# Patient Record
Sex: Female | Born: 1990 | Race: White | Hispanic: No | Marital: Married | Smoking: Never smoker
Health system: Southern US, Community
[De-identification: ages and names within clinical notes are randomized; demographics above are authoritative.]

## PROBLEM LIST (undated history)

## (undated) DIAGNOSIS — D649 Anemia, unspecified: Secondary | ICD-10-CM

## (undated) DIAGNOSIS — E039 Hypothyroidism, unspecified: Secondary | ICD-10-CM

## (undated) HISTORY — PX: DENTAL SURGERY: SHX609

## (undated) HISTORY — PX: TONSILLECTOMY: SUR1361

## (undated) HISTORY — PX: MICRODISCECTOMY LUMBAR: SUR864

---

## 2017-01-20 ENCOUNTER — Emergency Department (HOSPITAL_COMMUNITY)
Admission: EM | Admit: 2017-01-20 | Discharge: 2017-01-20 | Disposition: A | Discharge status: Home / Self Care | Payer: BC Managed Care – PPO | Attending: Emergency Medicine | Admitting: Emergency Medicine

## 2017-01-20 ENCOUNTER — Encounter (HOSPITAL_COMMUNITY): Payer: Self-pay

## 2017-01-20 DIAGNOSIS — M5441 Lumbago with sciatica, right side: Secondary | ICD-10-CM | POA: Diagnosis not present

## 2017-01-20 DIAGNOSIS — M545 Low back pain: Secondary | ICD-10-CM | POA: Diagnosis present

## 2017-01-20 MED ORDER — OXYCODONE-ACETAMINOPHEN 5-325 MG PO TABS
1.0000 | ORAL_TABLET | Freq: Once | ORAL | Status: AC
  Administered 2017-01-20: 1 via ORAL
  Filled 2017-01-20: qty 1

## 2017-01-20 MED ORDER — CYCLOBENZAPRINE HCL 10 MG PO TABS: 10.0000 mg | ORAL_TABLET | Freq: Two times a day (BID) | ORAL | 0 refills | Status: DC | PRN

## 2017-01-20 MED ORDER — TRAMADOL HCL 50 MG PO TABS: 50.0000 mg | ORAL_TABLET | Freq: Four times a day (QID) | ORAL | 0 refills | Status: DC | PRN

## 2017-01-20 NOTE — ED Triage Notes (Signed)
Patient c/o back pain right lower back pain with pain radiating down the right leg. Patient has been seeing a chiropractor for this pain. Patient states the pain has been getting progressively worse. Patient states she has also been to an UC and received a steroid injection and muscle relaxants with no relief.

## 2017-01-20 NOTE — ED Provider Notes (Signed)
[facility]-EMERGENCY DEPT Provider Note   CSN: [id number] Arrival date & time: 01/20/17  0803     History   Chief Complaint Chief Complaint  Patient presents with  . Back Pain    HPI Alexandra Hamilton is a 26 y.o. female without significant past medical history, presenting to the ED with 5 weeks of persistent worsening right-sided lower back pain with radiculopathy down the right leg. Patient states she has seen both a chiropractor and urgent care for these symptoms without relief. She states urgent care recently prescribed her Flexeril and prednisone which she just finished yesterday, however pain has been persistent and worsening. She states pain is sharp, located in the right buttock with radiation down the lateral aspect of her leg. No recent injury. Denies midline back pain, numbness or tingling, bowel or bladder incontinence, saddle paresthesia, fever, urinary symptoms or abdominal pain, history of cancer or IV drug use.  The history is provided by the patient.    Past Medical History:  Diagnosis Date  . Thyroid disease     There are no active problems to display for this patient.   Past Surgical History:  Procedure Laterality Date  . DENTAL SURGERY    . TONSILLECTOMY      OB History    No data available       Home Medications    Prior to Admission medications   Medication Sig Start Date End Date Taking? Authorizing Provider  cyclobenzaprine (FLEXERIL) 10 MG tablet Take 1 tablet (10 mg total) by mouth 2 (two) times daily as needed for muscle spasms. 01/20/17   Russo, [place] N, PA-C  traMADol (ULTRAM) 50 MG tablet Take 1 tablet (50 mg total) by mouth every 6 (six) hours as needed for severe pain. 01/20/17   Russo, [place] N, PA-C    Family History History reviewed. No pertinent family history.  Social History Social History  Substance Use Topics  . Smoking status: Never Smoker  . Smokeless tobacco: Never Used  . Alcohol use Yes   Comment: socially     Allergies   Patient has no known allergies.   Review of Systems Review of Systems  Constitutional: Negative for fever.  Gastrointestinal: Negative for abdominal pain.       No bowel incontinence  Genitourinary: Negative for difficulty urinating, dysuria and flank pain.  Musculoskeletal: Positive for back pain and myalgias. Negative for neck pain.  Neurological: Negative for weakness and numbness.     Physical Exam Updated Vital Signs BP 122/85 (BP Location: Right Arm)   Pulse 75   Temp (!) 97.5 F (36.4 C) (Oral)   Resp 18   Ht 5\' 8"  (1.727 m)   Wt 68 kg (150 lb)   LMP 12/19/2016   SpO2 98%   BMI 22.81 kg/m   Physical Exam  Constitutional: She appears well-developed and well-nourished.  Patient appears uncomfortable, constantly changing positions and going from sitting to standing.  HENT:  Head: Normocephalic and atraumatic.  Eyes: Conjunctivae are normal.  Neck: Normal range of motion. Neck supple.  Cardiovascular: Normal rate, regular rhythm, normal heart sounds and intact distal pulses.   Pulmonary/Chest: Effort normal and breath sounds normal.  Abdominal: Soft. Bowel sounds are normal. There is no tenderness.  Musculoskeletal:       Back:  No midline C, T, or L-spine tenderness, no bony step-offs, no gross deformities. Tenderness located right gluteus. Moving all extremities.  Neurological:  Motor:  Normal tone. 5/5 in upper and lower extremities  bilaterally including strong and equal grip strength and dorsiflexion/plantar flexion Sensory: Pinprick and light touch normal in all extremities.  Deep Tendon Reflexes: 2+ and symmetric in the biceps and patella Gait: normal gait and balance CV: distal pulses palpable throughout    Psychiatric: She has a normal mood and affect. Her behavior is normal.  Nursing note and vitals reviewed.    ED Treatments / Results  Labs (all labs ordered are listed, but only abnormal results are  displayed) Labs Reviewed - No data to display  EKG  EKG Interpretation None       Radiology No results found.  Procedures Procedures (including critical care time)  Medications Ordered in ED Medications  oxyCODONE-acetaminophen (PERCOCET/ROXICET) 5-325 MG per tablet 1 tablet (1 tablet Oral Given 01/20/17 0926)     Initial Impression / Assessment and Plan / ED Course  I have reviewed the triage vital signs and the nursing notes.  Pertinent labs & imaging results that were available during my care of the patient were reviewed by me and considered in my medical decision making (see chart for details).     Patient presenting with 5 weeks of right-sided lower back pain with radiculopathy, consistent with sciatica. Normal neurological exam, no evidence of urinary incontinence or retention, pain is consistently reproducible. No midline spinal tenderness or recent trauma; imaging not indicated. There is no evidence of AAA or concern for dissection at this time.   Patient can walk but states is painful.  No loss of bowel or bladder control.  No concern for cauda equina.  No fever, night sweats, weight loss, h/o cancer, IVDU.  Pain treated here in the department with adequate improvement. RICE protocol and pain medicine indicated and discussed with patient. Recommend patient establish primary care for management of pain, and discussion of need for specialist referral versus PT. I have also discussed reasons to return immediately to the ER. Patient expresses understanding and agrees with plan.  Patient discussed with Dr. Ethelda Chick.  [place] Controlled Substance reporting System queried  Discussed results, findings, treatment and follow up. Patient advised of return precautions. Patient verbalized understanding and agreed with plan.   Final Clinical Impressions(s) / ED Diagnoses   Final diagnoses:  Acute right-sided low back pain with right-sided sciatica    New  Prescriptions Discharge Medication List as of 01/20/2017 10:06 AM    START taking these medications   Details  cyclobenzaprine (FLEXERIL) 10 MG tablet Take 1 tablet (10 mg total) by mouth 2 (two) times daily as needed for muscle spasms., Starting Thu 01/20/2017, Print    traMADol (ULTRAM) 50 MG tablet Take 1 tablet (50 mg total) by mouth every 6 (six) hours as needed for severe pain., Starting Thu 01/20/2017, Print         Timothy Lasso, [place] N, PA-C 01/20/17 1504    Doug Sou, MD 01/20/17 [phone]

## 2017-01-20 NOTE — Discharge Instructions (Signed)
Please read instructions below.  It is important that you establish primary care to discuss need for physical therapy versus visit with specialist. Use the phone number on the back of the discharge paperwork to find a primary care. You can take tramadol every 6 hours as needed for pain.  You can take flexeril at bedtime or every 12 hours as needed for muscle spasm. Drink plenty of water. You can try gentle stretches. Return to ER if new numbness or tingling in your arms or legs, inability to urinate, inability to hold your bowels, or weakness in your extremities.

## 2017-03-04 ENCOUNTER — Encounter (HOSPITAL_COMMUNITY): Payer: Self-pay | Admitting: Emergency Medicine

## 2017-03-04 ENCOUNTER — Emergency Department (HOSPITAL_COMMUNITY)
Admission: EM | Admit: 2017-03-04 | Discharge: 2017-03-04 | Disposition: A | Discharge status: Home / Self Care | Payer: BC Managed Care – PPO | Attending: Emergency Medicine | Admitting: Emergency Medicine

## 2017-03-04 ENCOUNTER — Other Ambulatory Visit: Payer: Self-pay

## 2017-03-04 DIAGNOSIS — Z5321 Procedure and treatment not carried out due to patient leaving prior to being seen by health care provider: Secondary | ICD-10-CM | POA: Insufficient documentation

## 2017-03-04 DIAGNOSIS — R51 Headache: Secondary | ICD-10-CM | POA: Diagnosis present

## 2017-03-04 NOTE — ED Notes (Signed)
Called Pt to be roomed x3 no response. 

## 2017-03-04 NOTE — ED Triage Notes (Signed)
Pt complaint of frontal headache, neck pain, n/v for a few days; recent back surgery.

## 2017-03-04 NOTE — ED Notes (Signed)
Called Pt to be roomed x2 no response. 

## 2017-03-04 NOTE — ED Notes (Signed)
Called Pt to be roomed x1 no response.

## 2017-03-07 ENCOUNTER — Ambulatory Visit (HOSPITAL_COMMUNITY)
Admission: RE | Admit: 2017-03-07 | Discharge: 2017-03-07 | Disposition: A | Discharge status: Home / Self Care | Payer: BC Managed Care – PPO | Source: Ambulatory Visit | Attending: Neurological Surgery | Admitting: Neurological Surgery

## 2017-03-07 ENCOUNTER — Other Ambulatory Visit (HOSPITAL_COMMUNITY): Payer: Self-pay | Admitting: Neurological Surgery

## 2017-03-07 DIAGNOSIS — Z9889 Other specified postprocedural states: Secondary | ICD-10-CM | POA: Diagnosis not present

## 2017-03-07 DIAGNOSIS — R937 Abnormal findings on diagnostic imaging of other parts of musculoskeletal system: Secondary | ICD-10-CM | POA: Diagnosis not present

## 2017-03-07 DIAGNOSIS — G971 Other reaction to spinal and lumbar puncture: Secondary | ICD-10-CM

## 2017-03-07 MED ORDER — GADOBENATE DIMEGLUMINE 529 MG/ML IV SOLN
15.0000 mL | Freq: Once | INTRAVENOUS | Status: AC
  Administered 2017-03-07: 14 mL via INTRAVENOUS

## 2017-05-03 ENCOUNTER — Other Ambulatory Visit: Payer: Self-pay | Admitting: Neurological Surgery

## 2017-05-03 DIAGNOSIS — M5126 Other intervertebral disc displacement, lumbar region: Secondary | ICD-10-CM

## 2017-05-16 ENCOUNTER — Encounter: Payer: Self-pay | Admitting: Radiology

## 2017-05-16 ENCOUNTER — Ambulatory Visit
Admission: RE | Admit: 2017-05-16 | Discharge: 2017-05-16 | Disposition: A | Discharge status: Home / Self Care | Payer: BC Managed Care – PPO | Source: Ambulatory Visit | Attending: Neurological Surgery | Admitting: Neurological Surgery

## 2017-05-16 DIAGNOSIS — M5126 Other intervertebral disc displacement, lumbar region: Secondary | ICD-10-CM

## 2017-05-16 MED ORDER — DIAZEPAM 5 MG PO TABS
5.0000 mg | ORAL_TABLET | Freq: Once | ORAL | Status: AC
  Administered 2017-05-16: 5 mg via ORAL

## 2017-05-16 MED ORDER — DIAZEPAM 5 MG PO TABS: 10.0000 mg | ORAL_TABLET | Freq: Once | ORAL | Status: DC

## 2017-05-16 MED ORDER — IOPAMIDOL (ISOVUE-M 200) INJECTION 41%
15.0000 mL | Freq: Once | INTRAMUSCULAR | Status: AC
  Administered 2017-05-16: 15 mL via INTRATHECAL

## 2017-05-16 NOTE — Discharge Instructions (Signed)

## 2017-09-06 ENCOUNTER — Other Ambulatory Visit: Payer: Self-pay | Admitting: Neurological Surgery

## 2017-09-06 DIAGNOSIS — G9782 Other postprocedural complications and disorders of nervous system: Secondary | ICD-10-CM

## 2017-09-30 ENCOUNTER — Other Ambulatory Visit: Payer: BC Managed Care – PPO

## 2017-10-21 ENCOUNTER — Ambulatory Visit
Admission: RE | Admit: 2017-10-21 | Discharge: 2017-10-21 | Disposition: A | Discharge status: Home / Self Care | Payer: BC Managed Care – PPO | Source: Ambulatory Visit | Attending: Neurological Surgery | Admitting: Neurological Surgery

## 2017-10-21 DIAGNOSIS — G9782 Other postprocedural complications and disorders of nervous system: Secondary | ICD-10-CM

## 2017-10-21 DIAGNOSIS — G9600 Cerebrospinal fluid leak, unspecified: Secondary | ICD-10-CM

## 2017-10-21 MED ORDER — IOPAMIDOL (ISOVUE-M 200) INJECTION 41%
15.0000 mL | Freq: Once | INTRAMUSCULAR | Status: AC
  Administered 2017-10-21: 15 mL via INTRATHECAL

## 2017-10-21 MED ORDER — DIAZEPAM 5 MG PO TABS
5.0000 mg | ORAL_TABLET | Freq: Once | ORAL | Status: AC
  Administered 2017-10-21: 5 mg via ORAL

## 2017-10-21 MED ORDER — DIAZEPAM 5 MG PO TABS
10.0000 mg | ORAL_TABLET | Freq: Once | ORAL | Status: DC
Start: 2017-10-21 — End: 2017-10-21

## 2017-10-21 NOTE — Discharge Instructions (Signed)

## 2017-10-27 ENCOUNTER — Other Ambulatory Visit: Payer: Self-pay | Admitting: Neurological Surgery

## 2017-10-31 ENCOUNTER — Encounter (HOSPITAL_COMMUNITY): Payer: Self-pay | Admitting: *Deleted

## 2017-10-31 ENCOUNTER — Other Ambulatory Visit: Payer: Self-pay

## 2017-10-31 NOTE — Progress Notes (Signed)
Denies chest pain, shob, or cardiology visit. Denies cardiac test. Patient aware of needed urine specimen morning of surgery.

## 2017-11-02 ENCOUNTER — Other Ambulatory Visit: Payer: Self-pay

## 2017-11-02 ENCOUNTER — Encounter (HOSPITAL_COMMUNITY)
Admission: RE | Disposition: A | Discharge status: Home / Self Care | Payer: Self-pay | Source: Home / Self Care | Attending: Neurological Surgery

## 2017-11-02 ENCOUNTER — Inpatient Hospital Stay (HOSPITAL_COMMUNITY): Payer: BC Managed Care – PPO | Admitting: Certified Registered"

## 2017-11-02 ENCOUNTER — Inpatient Hospital Stay (HOSPITAL_COMMUNITY)
Admission: RE | Admit: 2017-11-02 | Discharge: 2017-11-04 | DRG: 029 | Disposition: A | Discharge status: Home / Self Care | Payer: BC Managed Care – PPO | Attending: Neurological Surgery | Admitting: Neurological Surgery

## 2017-11-02 ENCOUNTER — Encounter (HOSPITAL_COMMUNITY): Payer: Self-pay | Admitting: Anesthesiology

## 2017-11-02 DIAGNOSIS — G9619 Other disorders of meninges, not elsewhere classified: Secondary | ICD-10-CM | POA: Diagnosis present

## 2017-11-02 DIAGNOSIS — G9600 Cerebrospinal fluid leak, unspecified: Secondary | ICD-10-CM | POA: Diagnosis present

## 2017-11-02 DIAGNOSIS — Z79890 Hormone replacement therapy (postmenopausal): Secondary | ICD-10-CM

## 2017-11-02 DIAGNOSIS — G9782 Other postprocedural complications and disorders of nervous system: Secondary | ICD-10-CM | POA: Diagnosis present

## 2017-11-02 DIAGNOSIS — Y839 Surgical procedure, unspecified as the cause of abnormal reaction of the patient, or of later complication, without mention of misadventure at the time of the procedure: Secondary | ICD-10-CM | POA: Diagnosis present

## 2017-11-02 DIAGNOSIS — E039 Hypothyroidism, unspecified: Secondary | ICD-10-CM | POA: Diagnosis present

## 2017-11-02 HISTORY — DX: Hypothyroidism, unspecified: E03.9

## 2017-11-02 HISTORY — PX: LUMBAR LAMINECTOMY/DECOMPRESSION MICRODISCECTOMY: SHX5026

## 2017-11-02 HISTORY — DX: Anemia, unspecified: D64.9

## 2017-11-02 LAB — CBC
HEMATOCRIT: 39.2 % (ref 36.0–46.0)
Hemoglobin: 12.9 g/dL (ref 12.0–15.0)
MCH: 29.3 pg (ref 26.0–34.0)
MCHC: 32.9 g/dL (ref 30.0–36.0)
MCV: 88.9 fL (ref 78.0–100.0)
PLATELETS: 191 10*3/uL (ref 150–400)
RBC: 4.41 MIL/uL (ref 3.87–5.11)
RDW: 12.4 % (ref 11.5–15.5)
WBC: 7.1 10*3/uL (ref 4.0–10.5)

## 2017-11-02 LAB — POCT PREGNANCY, URINE: PREG TEST UR: NEGATIVE

## 2017-11-02 SURGERY — LUMBAR LAMINECTOMY/DECOMPRESSION MICRODISCECTOMY 1 LEVEL
Anesthesia: General | Site: Back

## 2017-11-02 MED ORDER — HYDROMORPHONE HCL 1 MG/ML IJ SOLN
INTRAMUSCULAR | Status: AC
  Filled 2017-11-02: qty 1

## 2017-11-02 MED ORDER — MENTHOL 3 MG MT LOZG: 1.0000 | LOZENGE | OROMUCOSAL | Status: DC | PRN

## 2017-11-02 MED ORDER — LIDOCAINE 2% (20 MG/ML) 5 ML SYRINGE
INTRAMUSCULAR | Status: AC
  Filled 2017-11-02: qty 5

## 2017-11-02 MED ORDER — FENTANYL CITRATE (PF) 100 MCG/2ML IJ SOLN
INTRAMUSCULAR | Status: AC
  Filled 2017-11-02: qty 2

## 2017-11-02 MED ORDER — FENTANYL CITRATE (PF) 250 MCG/5ML IJ SOLN
INTRAMUSCULAR | Status: AC
  Filled 2017-11-02: qty 5

## 2017-11-02 MED ORDER — FENTANYL CITRATE (PF) 100 MCG/2ML IJ SOLN
25.0000 ug | INTRAMUSCULAR | Status: DC | PRN
  Administered 2017-11-02 (×3): 50 ug via INTRAVENOUS

## 2017-11-02 MED ORDER — SODIUM CHLORIDE 0.9% FLUSH
3.0000 mL | Freq: Two times a day (BID) | INTRAVENOUS | Status: DC
  Administered 2017-11-03 – 2017-11-04 (×2): 3 mL via INTRAVENOUS

## 2017-11-02 MED ORDER — HYDROMORPHONE HCL 1 MG/ML IJ SOLN
0.2500 mg | INTRAMUSCULAR | Status: DC | PRN
  Administered 2017-11-02: 0.5 mg via INTRAVENOUS

## 2017-11-02 MED ORDER — ACETAMINOPHEN 325 MG PO TABS: 650.0000 mg | ORAL_TABLET | ORAL | Status: DC | PRN

## 2017-11-02 MED ORDER — ONDANSETRON HCL 4 MG/2ML IJ SOLN
INTRAMUSCULAR | Status: DC | PRN
  Administered 2017-11-02: 4 mg via INTRAVENOUS

## 2017-11-02 MED ORDER — MIDAZOLAM HCL 2 MG/2ML IJ SOLN
INTRAMUSCULAR | Status: AC
  Filled 2017-11-02: qty 2

## 2017-11-02 MED ORDER — FENTANYL CITRATE (PF) 100 MCG/2ML IJ SOLN
INTRAMUSCULAR | Status: AC
Start: 2017-11-02 — End: 2017-11-03
  Filled 2017-11-02: qty 2

## 2017-11-02 MED ORDER — CELECOXIB 200 MG PO CAPS
200.0000 mg | ORAL_CAPSULE | Freq: Two times a day (BID) | ORAL | Status: DC
  Administered 2017-11-02 – 2017-11-04 (×4): 200 mg via ORAL
  Filled 2017-11-02 (×4): qty 1

## 2017-11-02 MED ORDER — OXYCODONE HCL 5 MG PO TABS
5.0000 mg | ORAL_TABLET | ORAL | Status: DC | PRN
  Administered 2017-11-03 – 2017-11-04 (×5): 5 mg via ORAL
  Filled 2017-11-02 (×6): qty 1

## 2017-11-02 MED ORDER — CHLORHEXIDINE GLUCONATE CLOTH 2 % EX PADS: 6.0000 | MEDICATED_PAD | Freq: Once | CUTANEOUS | Status: DC

## 2017-11-02 MED ORDER — SUGAMMADEX SODIUM 200 MG/2ML IV SOLN
INTRAVENOUS | Status: DC | PRN
  Administered 2017-11-02: 150 mg via INTRAVENOUS

## 2017-11-02 MED ORDER — LEVOTHYROXINE SODIUM 75 MCG PO TABS
75.0000 ug | ORAL_TABLET | Freq: Every day | ORAL | Status: DC
  Administered 2017-11-03 – 2017-11-04 (×2): 75 ug via ORAL
  Filled 2017-11-02 (×3): qty 1

## 2017-11-02 MED ORDER — ONDANSETRON HCL 4 MG/2ML IJ SOLN
INTRAMUSCULAR | Status: AC
  Filled 2017-11-02: qty 2

## 2017-11-02 MED ORDER — CEFAZOLIN SODIUM-DEXTROSE 1-4 GM/50ML-% IV SOLN
1.0000 g | Freq: Three times a day (TID) | INTRAVENOUS | Status: AC
  Administered 2017-11-02 – 2017-11-03 (×2): 1 g via INTRAVENOUS
  Filled 2017-11-02 (×2): qty 50

## 2017-11-02 MED ORDER — CEFAZOLIN SODIUM-DEXTROSE 2-4 GM/100ML-% IV SOLN
2.0000 g | INTRAVENOUS | Status: AC
  Administered 2017-11-02: 2 g via INTRAVENOUS
  Filled 2017-11-02: qty 100

## 2017-11-02 MED ORDER — PROMETHAZINE HCL 25 MG/ML IJ SOLN
6.2500 mg | INTRAMUSCULAR | Status: DC | PRN
  Administered 2017-11-02: 6.25 mg via INTRAVENOUS

## 2017-11-02 MED ORDER — ACETAMINOPHEN 650 MG RE SUPP: 650.0000 mg | RECTAL | Status: DC | PRN

## 2017-11-02 MED ORDER — SODIUM CHLORIDE 0.9 % IV SOLN
INTRAVENOUS | Status: DC | PRN
  Administered 2017-11-02: 16:00:00

## 2017-11-02 MED ORDER — PHENOL 1.4 % MT LIQD: 1.0000 | OROMUCOSAL | Status: DC | PRN

## 2017-11-02 MED ORDER — ONDANSETRON HCL 4 MG/2ML IJ SOLN: 4.0000 mg | Freq: Four times a day (QID) | INTRAMUSCULAR | Status: DC | PRN

## 2017-11-02 MED ORDER — HYDROMORPHONE HCL 1 MG/ML IJ SOLN
0.5000 mg | INTRAMUSCULAR | Status: DC | PRN
  Administered 2017-11-02 – 2017-11-03 (×2): 0.5 mg via INTRAVENOUS
  Filled 2017-11-02 (×2): qty 0.5

## 2017-11-02 MED ORDER — LIDOCAINE 2% (20 MG/ML) 5 ML SYRINGE
INTRAMUSCULAR | Status: DC | PRN
  Administered 2017-11-02: 60 mg via INTRAVENOUS

## 2017-11-02 MED ORDER — THROMBIN 5000 UNITS EX SOLR
CUTANEOUS | Status: DC | PRN
  Administered 2017-11-02 (×2): 5000 [IU] via TOPICAL

## 2017-11-02 MED ORDER — ONDANSETRON HCL 4 MG PO TABS: 4.0000 mg | ORAL_TABLET | Freq: Four times a day (QID) | ORAL | Status: DC | PRN

## 2017-11-02 MED ORDER — SODIUM CHLORIDE 0.9 % IV SOLN: 250.0000 mL | INTRAVENOUS | Status: DC

## 2017-11-02 MED ORDER — ROCURONIUM BROMIDE 10 MG/ML (PF) SYRINGE
PREFILLED_SYRINGE | INTRAVENOUS | Status: DC | PRN
  Administered 2017-11-02: 40 mg via INTRAVENOUS

## 2017-11-02 MED ORDER — ACETAMINOPHEN 10 MG/ML IV SOLN
1000.0000 mg | INTRAVENOUS | Status: AC
  Administered 2017-11-02: 1000 mg via INTRAVENOUS
  Filled 2017-11-02 (×2): qty 100

## 2017-11-02 MED ORDER — BUPIVACAINE HCL (PF) 0.25 % IJ SOLN
INTRAMUSCULAR | Status: DC | PRN
  Administered 2017-11-02: 8 mL

## 2017-11-02 MED ORDER — SODIUM CHLORIDE 0.9% FLUSH: 3.0000 mL | INTRAVENOUS | Status: DC | PRN

## 2017-11-02 MED ORDER — HEMOSTATIC AGENTS (NO CHARGE) OPTIME
TOPICAL | Status: DC | PRN
  Administered 2017-11-02 (×2): 1 via TOPICAL

## 2017-11-02 MED ORDER — LACTATED RINGERS IV SOLN
INTRAVENOUS | Status: DC
  Administered 2017-11-02 (×2): via INTRAVENOUS

## 2017-11-02 MED ORDER — THROMBIN 5000 UNITS EX SOLR
CUTANEOUS | Status: AC
  Filled 2017-11-02: qty 15000

## 2017-11-02 MED ORDER — 0.9 % SODIUM CHLORIDE (POUR BTL) OPTIME
TOPICAL | Status: DC | PRN
  Administered 2017-11-02: 1000 mL

## 2017-11-02 MED ORDER — DEXAMETHASONE SODIUM PHOSPHATE 10 MG/ML IJ SOLN
INTRAMUSCULAR | Status: AC
  Filled 2017-11-02: qty 1

## 2017-11-02 MED ORDER — PROMETHAZINE HCL 25 MG/ML IJ SOLN
INTRAMUSCULAR | Status: AC
  Filled 2017-11-02: qty 1

## 2017-11-02 MED ORDER — DEXAMETHASONE SODIUM PHOSPHATE 10 MG/ML IJ SOLN
INTRAMUSCULAR | Status: DC | PRN
  Administered 2017-11-02: 10 mg via INTRAVENOUS

## 2017-11-02 MED ORDER — SENNA 8.6 MG PO TABS
1.0000 | ORAL_TABLET | Freq: Two times a day (BID) | ORAL | Status: DC
  Administered 2017-11-02 – 2017-11-04 (×4): 8.6 mg via ORAL
  Filled 2017-11-02 (×4): qty 1

## 2017-11-02 MED ORDER — SUGAMMADEX SODIUM 200 MG/2ML IV SOLN
INTRAVENOUS | Status: AC
  Filled 2017-11-02: qty 2

## 2017-11-02 MED ORDER — POTASSIUM CHLORIDE IN NACL 20-0.9 MEQ/L-% IV SOLN
INTRAVENOUS | Status: DC
  Administered 2017-11-02 – 2017-11-03 (×2): via INTRAVENOUS
  Filled 2017-11-02 (×2): qty 1000

## 2017-11-02 MED ORDER — FENTANYL CITRATE (PF) 100 MCG/2ML IJ SOLN
INTRAMUSCULAR | Status: DC | PRN
  Administered 2017-11-02: 100 ug via INTRAVENOUS
  Administered 2017-11-02 (×4): 50 ug via INTRAVENOUS

## 2017-11-02 MED ORDER — PROPOFOL 10 MG/ML IV BOLUS
INTRAVENOUS | Status: AC
  Filled 2017-11-02: qty 20

## 2017-11-02 MED ORDER — MIDAZOLAM HCL 5 MG/5ML IJ SOLN
INTRAMUSCULAR | Status: DC | PRN
  Administered 2017-11-02: 2 mg via INTRAVENOUS

## 2017-11-02 MED ORDER — BUPIVACAINE HCL (PF) 0.25 % IJ SOLN
INTRAMUSCULAR | Status: AC
  Filled 2017-11-02: qty 30

## 2017-11-02 MED ORDER — PROPOFOL 10 MG/ML IV BOLUS
INTRAVENOUS | Status: DC | PRN
  Administered 2017-11-02: 150 mg via INTRAVENOUS
  Administered 2017-11-02: 30 mg via INTRAVENOUS
  Administered 2017-11-02: 20 mg via INTRAVENOUS

## 2017-11-02 SURGICAL SUPPLY — 50 items
BAG DECANTER FOR FLEXI CONT (MISCELLANEOUS) ×3 IMPLANT
BENZOIN TINCTURE PRP APPL 2/3 (GAUZE/BANDAGES/DRESSINGS) ×3 IMPLANT
BUR MATCHSTICK NEURO 3.0 LAGG (BURR) ×3 IMPLANT
CANISTER SUCT 3000ML PPV (MISCELLANEOUS) ×3 IMPLANT
CARTRIDGE OIL MAESTRO DRILL (MISCELLANEOUS) IMPLANT
CLOSURE WOUND 1/2 X4 (GAUZE/BANDAGES/DRESSINGS) ×2
DECANTER SPIKE VIAL GLASS SM (MISCELLANEOUS) ×3 IMPLANT
DERMABOND ADVANCED (GAUZE/BANDAGES/DRESSINGS) ×2
DERMABOND ADVANCED .7 DNX12 (GAUZE/BANDAGES/DRESSINGS) ×1 IMPLANT
DIFFUSER DRILL AIR PNEUMATIC (MISCELLANEOUS) IMPLANT
DRAPE LAPAROTOMY 100X72X124 (DRAPES) ×3 IMPLANT
DRAPE MICROSCOPE LEICA (MISCELLANEOUS) IMPLANT
DRAPE POUCH INSTRU U-SHP 10X18 (DRAPES) IMPLANT
DRAPE SURG 17X23 STRL (DRAPES) ×3 IMPLANT
DRSG OPSITE POSTOP 4X6 (GAUZE/BANDAGES/DRESSINGS) ×3 IMPLANT
DURAPREP 26ML APPLICATOR (WOUND CARE) ×3 IMPLANT
ELECT REM PT RETURN 9FT ADLT (ELECTROSURGICAL) ×3
ELECTRODE REM PT RTRN 9FT ADLT (ELECTROSURGICAL) ×1 IMPLANT
GAUZE SPONGE 4X4 16PLY XRAY LF (GAUZE/BANDAGES/DRESSINGS) IMPLANT
GLOVE BIO SURGEON STRL SZ7 (GLOVE) ×3 IMPLANT
GLOVE BIO SURGEON STRL SZ8 (GLOVE) ×3 IMPLANT
GLOVE BIOGEL PI IND STRL 7.0 (GLOVE) ×1 IMPLANT
GLOVE BIOGEL PI INDICATOR 7.0 (GLOVE) ×2
GOWN STRL REUS W/ TWL LRG LVL3 (GOWN DISPOSABLE) ×2 IMPLANT
GOWN STRL REUS W/ TWL XL LVL3 (GOWN DISPOSABLE) ×1 IMPLANT
GOWN STRL REUS W/TWL 2XL LVL3 (GOWN DISPOSABLE) IMPLANT
GOWN STRL REUS W/TWL LRG LVL3 (GOWN DISPOSABLE) ×4
GOWN STRL REUS W/TWL XL LVL3 (GOWN DISPOSABLE) ×2
HEMOSTAT POWDER KIT SURGIFOAM (HEMOSTASIS) IMPLANT
KIT BASIN OR (CUSTOM PROCEDURE TRAY) ×3 IMPLANT
KIT TURNOVER KIT B (KITS) ×3 IMPLANT
NEEDLE HYPO 25X1 1.5 SAFETY (NEEDLE) ×3 IMPLANT
NEEDLE SPNL 20GX3.5 QUINCKE YW (NEEDLE) IMPLANT
NS IRRIG 1000ML POUR BTL (IV SOLUTION) ×3 IMPLANT
OIL CARTRIDGE MAESTRO DRILL (MISCELLANEOUS)
PACK LAMINECTOMY NEURO (CUSTOM PROCEDURE TRAY) ×3 IMPLANT
PAD ARMBOARD 7.5X6 YLW CONV (MISCELLANEOUS) ×9 IMPLANT
RUBBERBAND STERILE (MISCELLANEOUS) IMPLANT
SEALANT ADHERUS EXTEND TIP (MISCELLANEOUS) ×3 IMPLANT
SPONGE SURGIFOAM ABS GEL SZ50 (HEMOSTASIS) ×3 IMPLANT
STRIP CLOSURE SKIN 1/2X4 (GAUZE/BANDAGES/DRESSINGS) ×4 IMPLANT
SUT NURALON 4 0 TR CR/8 (SUTURE) ×3 IMPLANT
SUT PROLENE 6 0 BV (SUTURE) ×3 IMPLANT
SUT VIC AB 0 CT1 18XCR BRD8 (SUTURE) ×4 IMPLANT
SUT VIC AB 0 CT1 8-18 (SUTURE) ×8
SUT VIC AB 2-0 CP2 18 (SUTURE) ×6 IMPLANT
SUT VIC AB 3-0 SH 8-18 (SUTURE) ×6 IMPLANT
TOWEL GREEN STERILE (TOWEL DISPOSABLE) ×3 IMPLANT
TOWEL GREEN STERILE FF (TOWEL DISPOSABLE) ×3 IMPLANT
WATER STERILE IRR 1000ML POUR (IV SOLUTION) ×3 IMPLANT

## 2017-11-02 NOTE — Transfer of Care (Signed)
Immediate Anesthesia Transfer of Care Note  Patient: Alexandra Hamilton  Procedure(s) Performed: Lumbar Re-exploration for repair of cerebrospinal fluid leak (N/A Back)  Patient Location: PACU  Anesthesia Type:General  Level of Consciousness: awake, oriented and patient cooperative  Airway & Oxygen Therapy: Patient Spontanous Breathing and Patient connected to nasal cannula oxygen  Post-op Assessment: Report given to RN and Post -op Vital signs reviewed and stable  Post vital signs: Reviewed  Last Vitals:  Vitals Value Taken Time  BP 113/64 11/02/2017  4:53 PM  Temp    Pulse 78 11/02/2017  4:55 PM  Resp 17 11/02/2017  4:55 PM  SpO2 100 % 11/02/2017  4:55 PM  Vitals shown include unvalidated device data.  Last Pain:  Vitals:   11/02/17 1325  TempSrc:   PainSc: 0-No pain      Patients Stated Pain Goal: 1 (11/02/17 1325)  Complications: No apparent anesthesia complications

## 2017-11-02 NOTE — Op Note (Signed)
11/02/2017  4:34 PM  PATIENT:  Alexandra Hamilton  27 y.o. female  PRE-OPERATIVE DIAGNOSIS:  Lumbar pseudomeningocele  POST-OPERATIVE DIAGNOSIS:  same  PROCEDURE:  Lumbar reexploration with repair of pseudomeningocele requiring laminectomy  SURGEON:  Marikay Alaravid Jones, MD  ASSISTANTS: Verlin DikeKimberly Meyran FNP  ANESTHESIA:   General  EBL: 100 ml  Total I/O In: 1000 [I.V.:1000] Out: 100 [Other:100]  BLOOD ADMINISTERED: none  DRAINS: none  SPECIMEN:  none  INDICATION FOR PROCEDURE: This patient presented with swelling in her back. CT mammogram showed a pseudomeningocele.Recommended repair of the similar meningocele. Patient understood the risks, benefits, and alternatives and potential outcomes and wished to proceed.  PROCEDURE DETAILS: The patient was taken to the operating room and after induction of adequate generalized endotracheal anesthesia, the patient was rolled into the prone position on the Wilson frame and all pressure points were padded. The lumbar region was cleaned and then prepped with DuraPrep and draped in the usual sterile fashion. 5 cc of local anesthesia was injected and we ellipsed out the old incision. There was a release of large amounts of clear spinal fluid. The fascia was opened. The dura was visualized. There was a 3 mm dural tear near the midline at the superior edge of the sacrum. I placed a pledget and then used a 3 mm Kerrison punch to widen the laminotomy over the sacrum to expose the hole. The high-speed drill was used to drill the midline. I then placed 3 4-0 Nurolon sutures and had good closure of the durotomy. We tested with a Valsalva to 40. There was no evidence of CSF leak. The wound was irrigated. Fibrin glue was placed over the repair site. We then debrided the capsule from the pseudomeningocele. This exposed nice healthy tissue. We started closing the fascia. This was closed with interrupted 0 Vicryl. We placed relaxing incisions into the subcutaneous tissues. I  closed the subcutaneous tissues with 2-0 Vicryl and the subcuticular tissues with 3-0 Vicryl. The skin was then closed with Dermabond,benzoin and Steri-Strips. The drapes were removed, a sterile dressing was applied. The patient was awakened from general anesthesia and transferred to the recovery room in stable condition. At the end of the procedure all sponge, needle and instrument counts were correct.    PLAN OF CARE: Admit to inpatient   PATIENT DISPOSITION:  PACU - hemodynamically stable.   Delay start of Pharmacological VTE agent (>24hrs) due to surgical blood loss or risk of bleeding:  yes

## 2017-11-02 NOTE — H&P (Signed)
Subjective: Patient is a 27 y.o. female admitted for csf leak repair. Onset of symptoms was several months ago, unchanged since that time.  The pain is rated mild, and is located at the across the lower back. The pain is described as aching and occurs intermittently. The symptoms have been progressive. Symptoms are exacerbated by nothing in particular. MRI or CT showed pseudomeningocele. surgery last november   Past Medical History:  Diagnosis Date  . Anemia   . Hypothyroidism     Past Surgical History:  Procedure Laterality Date  . DENTAL SURGERY    . MICRODISCECTOMY LUMBAR    . TONSILLECTOMY      Prior to Admission medications   Medication Sig Start Date End Date Taking? Authorizing Provider  levothyroxine (SYNTHROID, LEVOTHROID) 75 MCG tablet Take 75 mcg by mouth daily before breakfast.   Yes [provider]   No Known Allergies  Social History   Tobacco Use  . Smoking status: Never Smoker  . Smokeless tobacco: Never Used  Substance Use Topics  . Alcohol use: Yes    Comment: socially; twice a month    History reviewed. No pertinent family history.   Review of Systems  Positive ROS: neg  All other systems have been reviewed and were otherwise negative with the exception of those mentioned in the HPI and as above.  Objective: Vital signs in last 24 hours: Temp:  [98.1 F (36.7 C)] 98.1 F (36.7 C) (07/31 1321) Pulse Rate:  [56] 56 (07/31 1321) Resp:  [18] 18 (07/31 1321) BP: (114)/(52) 114/52 (07/31 1321) SpO2:  [98 %] 98 % (07/31 1321) Weight:  [72.6 kg (160 lb)] 72.6 kg (160 lb) (07/31 1325)  General Appearance: Alert, cooperative, no distress, appears stated age Head: Normocephalic, without obvious abnormality, atraumatic Eyes: PERRL, conjunctiva/corneas clear, EOM's intact    Neck: Supple, symmetrical, trachea midline Back: Symmetric, no curvature, ROM normal, no CVA tenderness Lungs:  respirations unlabored Heart: Regular rate and rhythm Abdomen:  Soft, non-tender Extremities: Extremities normal, atraumatic, no cyanosis or edema Pulses: 2+ and symmetric all extremities Skin: Skin color, texture, turgor normal, no rashes or lesions  NEUROLOGIC:   Mental status: Alert and oriented x4,  no aphasia, good attention span, fund of knowledge, and memory Motor Exam - grossly normal Sensory Exam - grossly normal Reflexes: 1+ Coordination - grossly normal Gait - grossly normal Balance - grossly normal Cranial Nerves: I: smell Not tested  II: visual acuity  OS: nl    OD: nl  II: visual fields Full to confrontation  II: pupils Equal, round, reactive to light  III,VII: ptosis None  III,IV,VI: extraocular muscles  Full ROM  V: mastication Normal  V: facial light touch sensation  Normal  V,VII: corneal reflex  Present  VII: facial muscle function - upper  Normal  VII: facial muscle function - lower Normal  VIII: hearing Not tested  IX: soft palate elevation  Normal  IX,X: gag reflex Present  XI: trapezius strength  5/5  XI: sternocleidomastoid strength 5/5  XI: neck flexion strength  5/5  XII: tongue strength  Normal    Data Review Lab Results  Component Value Date   WBC 7.1 11/02/2017   HGB 12.9 11/02/2017   HCT 39.2 11/02/2017   MCV 88.9 11/02/2017   PLT 191 11/02/2017   No results found for: NA, K, CL, CO2, BUN, CREATININE, GLUCOSE No results found for: INR, PROTIME  Assessment/Plan:  Estimated body mass index is 24.33 kg/m as calculated from the following:  Height as of this encounter: 5\' 8"  (1.727 m).   Weight as of this encounter: 72.6 kg (160 lb). Patient admitted for pseudomeningocele repair. Patient has failed a reasonable attempt at conservative therapy.  I explained the condition and procedure to the patient and answered any questions.  Patient wishes to proceed with procedure as planned. Understands risks/ benefits and typical outcomes of procedure.   Omir Cooprider S 11/02/2017 2:53 PM

## 2017-11-02 NOTE — Anesthesia Procedure Notes (Signed)
Procedure Name: Intubation Date/Time: 11/02/2017 3:07 PM Performed by: Lovie Cholock, Keishana Klinger K, CRNA Pre-anesthesia Checklist: Patient identified, Emergency Drugs available, Suction available and Patient being monitored Patient Re-evaluated:Patient Re-evaluated prior to induction Oxygen Delivery Method: Circle System Utilized Preoxygenation: Pre-oxygenation with 100% oxygen Induction Type: IV induction Ventilation: Mask ventilation without difficulty Laryngoscope Size: Miller and 2 Grade View: Grade I Tube type: Oral Tube size: 7.0 mm Number of attempts: 1 Airway Equipment and Method: Stylet and Oral airway Placement Confirmation: ETT inserted through vocal cords under direct vision,  positive ETCO2 and breath sounds checked- equal and bilateral Secured at: 21 cm Tube secured with: Tape Dental Injury: Teeth and Oropharynx as per pre-operative assessment

## 2017-11-02 NOTE — Progress Notes (Signed)
Received  Alexandra Hamilton  27 year old female from Alexandra Hamilton PACU  S/p CSF leak and repair done by Dr Dub Amis.Jones . the patient , fully alert x O x 4 ,moves all extremities.  Pupils  equal and reactive  No distress pt denied any c/o ofH /A numbness nor tingling dressing with Honey comb drg. intact.dry and clean with no  CSF leak. POC  pertaining to flat bedrest explained to pt and pt verbalized . Call bell within reach to call for assist as needed. Vs stable. RN will continue to monitor pt.

## 2017-11-02 NOTE — Progress Notes (Signed)
Patient received 0.5mg  of dilaudid, in pyxis room wasted 0.75mg  instead of 0.5mg  with Karie Chimeraaylor Moring, RN called pharmacy to report error in pyxis, verbal order to write note about error made.  Hermina BartersBOWMAN, Zayana Salvador M, RN

## 2017-11-02 NOTE — Anesthesia Preprocedure Evaluation (Addendum)
Anesthesia Evaluation  Patient identified by MRN, date of birth, ID band Patient awake    Reviewed: Allergy & Precautions, NPO status , Patient's Chart, lab work & pertinent test results  Airway Mallampati: II  TM Distance: >3 FB Neck ROM: Full    Dental  (+) Teeth Intact, Dental Advisory Given   Pulmonary neg pulmonary ROS,    Pulmonary exam normal breath sounds clear to auscultation       Cardiovascular Exercise Tolerance: Good negative cardio ROS Normal cardiovascular exam Rhythm:Regular Rate:Normal     Neuro/Psych Postoperative CSF leak negative psych ROS   GI/Hepatic negative GI ROS, Neg liver ROS,   Endo/Other  Hypothyroidism   Renal/GU negative Renal ROS     Musculoskeletal negative musculoskeletal ROS (+)   Abdominal   Peds  Hematology negative hematology ROS (+)   Anesthesia Other Findings Day of surgery medications reviewed with the patient.  Reproductive/Obstetrics                            Anesthesia Physical Anesthesia Plan  ASA: II  Anesthesia Plan: General   Post-op Pain Management:    Induction: Intravenous  PONV Risk Score and Plan: 3 and Midazolam, Dexamethasone and Ondansetron  Airway Management Planned: Oral ETT  Additional Equipment:   Intra-op Plan:   Post-operative Plan: Extubation in OR  Informed Consent: I have reviewed the patients History and Physical, chart, labs and discussed the procedure including the risks, benefits and alternatives for the proposed anesthesia with the patient or authorized representative who has indicated his/her understanding and acceptance.   Dental advisory given  Plan Discussed with: CRNA  Anesthesia Plan Comments:         Anesthesia Quick Evaluation

## 2017-11-03 ENCOUNTER — Encounter (HOSPITAL_COMMUNITY): Payer: Self-pay | Admitting: Neurological Surgery

## 2017-11-03 MED ORDER — VITAMIN C 500 MG PO TABS
1000.0000 mg | ORAL_TABLET | Freq: Two times a day (BID) | ORAL | Status: DC
  Administered 2017-11-03 – 2017-11-04 (×3): 1000 mg via ORAL
  Filled 2017-11-03 (×3): qty 2

## 2017-11-03 MED ORDER — PROSIGHT PO TABS
1.0000 | ORAL_TABLET | Freq: Every day | ORAL | Status: DC
  Administered 2017-11-03 – 2017-11-04 (×2): 1 via ORAL
  Filled 2017-11-03 (×2): qty 1

## 2017-11-03 MED ORDER — VITAMIN E 180 MG (400 UNIT) PO CAPS
400.0000 [IU] | ORAL_CAPSULE | Freq: Every day | ORAL | Status: DC
  Administered 2017-11-03 – 2017-11-04 (×2): 400 [IU] via ORAL
  Filled 2017-11-03 (×2): qty 1

## 2017-11-03 NOTE — Progress Notes (Signed)
Subjective: Patient reports no headache, no leg pain or NTW  Objective: Vital signs in last 24 hours: Temp:  [97.7 F (36.5 C)-98.5 F (36.9 C)] 98.3 F (36.8 C) (08/01 0323) Pulse Rate:  [48-85] 69 (08/01 0323) Resp:  [5-25] 11 (07/31 1925) BP: (91-114)/(42-70) 91/42 (08/01 0323) SpO2:  [90 %-100 %] 98 % (08/01 0323) Weight:  [72.6 kg (160 lb)] 72.6 kg (160 lb) (07/31 1325)  Intake/Output from previous day: 07/31 0701 - 08/01 0700 In: 3000 [P.O.:700; I.V.:2150; IV Piggyback:150] Out: 1070 [Blood:20] Intake/Output this shift: No intake/output data recorded.  Neurologic: Grossly normal, dressing dry and flat  Lab Results: Lab Results  Component Value Date   WBC 7.1 11/02/2017   HGB 12.9 11/02/2017   HCT 39.2 11/02/2017   MCV 88.9 11/02/2017   PLT 191 11/02/2017   No results found for: INR, PROTIME BMET No results found for: NA, K, CL, CO2, GLUCOSE, BUN, CREATININE, CALCIUM  Studies/Results: No results found.  Assessment/Plan: Doing well, maybe up tomorrow  Estimated body mass index is 24.33 kg/m as calculated from the following:   Height as of this encounter: 5\' 8"  (1.727 m).   Weight as of this encounter: 72.6 kg (160 lb).    LOS: 1 day    Alexandra Hamilton S 11/03/2017, 7:43 AM

## 2017-11-03 NOTE — Progress Notes (Signed)
Foley Cath inserted per order. Pt. tolerated well, had 600cc clear, yellow urine.

## 2017-11-03 NOTE — Anesthesia Postprocedure Evaluation (Signed)
Anesthesia Post Note  Patient: Alexandra Hamilton  Procedure(s) Performed: Lumbar Re-exploration for repair of cerebrospinal fluid leak (N/A Back)     Patient location during evaluation: PACU Anesthesia Type: General Level of consciousness: awake and alert Pain management: pain level controlled Vital Signs Assessment: post-procedure vital signs reviewed and stable Respiratory status: spontaneous breathing, nonlabored ventilation, respiratory function stable and patient connected to nasal cannula oxygen Cardiovascular status: blood pressure returned to baseline and stable Postop Assessment: no apparent nausea or vomiting Anesthetic complications: no    Last Vitals:  Vitals:   11/03/17 0826 11/03/17 1200  BP: (!) 109/59 (!) 94/52  Pulse: (!) 53 79  Resp: 18 18  Temp: 37 C 36.9 C  SpO2: 100% 99%    Last Pain:  Vitals:   11/03/17 1200  TempSrc: Oral  PainSc: 5                  Cecile HearingStephen Edward Kaytlynn Kochan

## 2017-11-04 MED ORDER — METHOCARBAMOL 500 MG PO TABS: 500.0000 mg | ORAL_TABLET | Freq: Four times a day (QID) | ORAL | 0 refills | Status: AC

## 2017-11-04 MED ORDER — HYDROCODONE-ACETAMINOPHEN 5-325 MG PO TABS: 1.0000 | ORAL_TABLET | ORAL | 0 refills | Status: AC | PRN

## 2017-11-04 NOTE — Progress Notes (Signed)
Pt denies any headache or leg pain; pt foley removed at 1320 and ambulated 200 ft in hallway on unit with RN assistance. Pt voided in BR and ready for discharge. Dionne BucyP. Amo Day Deery RN

## 2017-11-04 NOTE — Discharge Summary (Signed)
Physician Discharge Summary  Patient ID: Alexandra Hamilton MRN: [id number] DOB/AGE: 28-15-1992 28 y.o.  Admit date: 11/02/2017 Discharge date: 11/04/2017  Admission Diagnoses: Lumbar pseudomeningocele      Discharge Diagnoses: same   Discharged Condition: good  Hospital Course: The patient was admitted on 11/02/2017 and taken to the operating room where the patient underwent repair pseudomeningocele. The patient tolerated the procedure well and was taken to the recovery room and then to the floor in stable condition. The hospital course was routine. There were no complications. The wound remained clean dry and intact. Pt had appropriate back soreness. No complaints of leg pain or new N/T/W. The patient remained afebrile with stable vital signs, and tolerated a regular diet. The patient continued to increase activities, and pain was well controlled with oral pain medications.   Consults: None  Significant Diagnostic Studies:  Results for orders placed or performed during the hospital encounter of 11/02/17  CBC  Result Value Ref Range   WBC 7.1 4.0 - 10.5 K/uL   RBC 4.41 3.87 - 5.11 MIL/uL   Hemoglobin 12.9 12.0 - 15.0 g/dL   HCT 40.9 81.1 - 91.4 %   MCV 88.9 78.0 - 100.0 fL   MCH 29.3 26.0 - 34.0 pg   MCHC 32.9 30.0 - 36.0 g/dL   RDW 78.2 95.6 - 21.3 %   Platelets 191 150 - 400 K/uL  Pregnancy, urine POC  Result Value Ref Range   Preg Test, Ur NEGATIVE NEGATIVE    Ct Lumbar Spine W Contrast  Result Date: 10/21/2017 CLINICAL DATA:  Postoperative CSF leak.  Pseudomeningocele. EXAM: LUMBAR MYELOGRAM FLUOROSCOPY TIME:  Radiation Exposure Index (as provided by the fluoroscopic device): 285.54 uGy*m2 If the device does not provide the exposure index: Fluoroscopy Time:  45 seconds Number of Acquired Images:  9 PROCEDURE: After thorough discussion of risks and benefits of the procedure including bleeding, infection, injury to nerves, blood vessels, adjacent structures as well as headache and  CSF leak, written and oral informed consent was obtained. Consent was obtained by Dr. Marin Roberts. Time out form was completed. Patient was positioned prone on the fluoroscopy table. Local anesthesia was provided with 1% lidocaine without epinephrine after prepped and draped in the usual sterile fashion. Puncture was performed at L2-3 using a 3 1/2 inch 22-gauge spinal needle via left paramedian approach. Using a single pass through the dura, the needle was placed within the thecal sac, with return of clear CSF. 15 mL of Isovue M-200 was injected into the thecal sac, with normal opacification of the nerve roots and cauda equina consistent with free flow within the subarachnoid space. I personally performed the lumbar puncture and administered the intrathecal contrast. I also personally supervised acquisition of the myelogram images. TECHNIQUE: Contiguous axial images were obtained through the Lumbar spine after the intrathecal infusion of infusion. Coronal and sagittal reconstructions were obtained of the axial image sets. COMPARISON:  Lumbar myelogram 05/16/2017 FINDINGS: LUMBAR MYELOGRAM FINDINGS: Intrathecal injection was monitored in the lateral position. There is early filling of the pseudomeningocele at the L5-S1 level. 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 This results in mild subarticular narrowing at the L5-S1 level. No other focal stenosis is present. The superficial aspect of the CSF collection has significantly increased in size since the prior exam. CT LUMBAR MYELOGRAM FINDINGS: Lumbar spine is imaged from the midbody of T12 through S2-3. Vertebral body heights are normal. Alignment is anatomic. The patient was scanned prone. Disc levels at L4-5 and above  are normal and stable. L5-S1: A residual broad-based disc protrusion is again seen. There is posterior displacement the S1 nerve roots, right greater than left. Right laminectomy is again seen. A large communicating pseudomeningocele  is present. The communication is identified posteriorly and laterally. There is no ventral communication. The residual disc is calcified. The collection distends the skin surface. It measures 9.5 x 9.0 x 8.6 cm. IMPRESSION: 1. Enlarging pseudomeningocele associated with CSF leak at the L5-S1 level. 2. The communication appears to be posterolateral at the laminectomy site. There is no ventral leak identified. 3. Residual broad-based disc protrusion with displacement of the S1 nerve roots bilaterally by a residual calcified disc. 4. No significant disc disease at the L4-5 level or above. Electronically Signed   By: Marin Robertshristopher  Mattern M.D.   On: 10/21/2017 13:06   Dg Myelography Lumbar Inj Lumbosacral  Result Date: 10/21/2017 CLINICAL DATA:  Postoperative CSF leak.  Pseudomeningocele. EXAM: LUMBAR MYELOGRAM FLUOROSCOPY TIME:  Radiation Exposure Index (as provided by the fluoroscopic device): 285.54 uGy*m2 If the device does not provide the exposure index: Fluoroscopy Time:  45 seconds Number of Acquired Images:  9 PROCEDURE: After thorough discussion of risks and benefits of the procedure including bleeding, infection, injury to nerves, blood vessels, adjacent structures as well as headache and CSF leak, written and oral informed consent was obtained. Consent was obtained by Dr. Marin Robertshristopher Mattern. Time out form was completed. Patient was positioned prone on the fluoroscopy table. Local anesthesia was provided with 1% lidocaine without epinephrine after prepped and draped in the usual sterile fashion. Puncture was performed at L2-3 using a 3 1/2 inch 22-gauge spinal needle via left paramedian approach. Using a single pass through the dura, the needle was placed within the thecal sac, with return of clear CSF. 15 mL of Isovue M-200 was injected into the thecal sac, with normal opacification of the nerve roots and cauda equina consistent with free flow within the subarachnoid space. I personally performed the  lumbar puncture and administered the intrathecal contrast. I also personally supervised acquisition of the myelogram images. TECHNIQUE: Contiguous axial images were obtained through the Lumbar spine after the intrathecal infusion of infusion. Coronal and sagittal reconstructions were obtained of the axial image sets. COMPARISON:  Lumbar myelogram 05/16/2017 FINDINGS: LUMBAR MYELOGRAM FINDINGS: Intrathecal injection was monitored in the lateral position. There is early filling of the pseudomeningocele at the L5-S1 level. 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 This results in mild subarticular narrowing at the L5-S1 level. No other focal stenosis is present. The superficial aspect of the CSF collection has significantly increased in size since the prior exam. CT LUMBAR MYELOGRAM FINDINGS: Lumbar spine is imaged from the midbody of T12 through S2-3. Vertebral body heights are normal. Alignment is anatomic. The patient was scanned prone. Disc levels at L4-5 and above are normal and stable. L5-S1: A residual broad-based disc protrusion is again seen. There is posterior displacement the S1 nerve roots, right greater than left. Right laminectomy is again seen. A large communicating pseudomeningocele is present. The communication is identified posteriorly and laterally. There is no ventral communication. The residual disc is calcified. The collection distends the skin surface. It measures 9.5 x 9.0 x 8.6 cm. IMPRESSION: 1. Enlarging pseudomeningocele associated with CSF leak at the L5-S1 level. 2. The communication appears to be posterolateral at the laminectomy site. There is no ventral leak identified. 3. Residual broad-based disc protrusion with displacement of the S1 nerve roots bilaterally by a residual calcified disc. 4.  No significant disc disease at the L4-5 level or above. Electronically Signed   By: Marin Roberts M.D.   On: 10/21/2017 13:06    Antibiotics:  Anti-infectives (From admission,  onward)   Start     Dose/Rate Route Frequency Ordered Stop   11/03/17 0600  ceFAZolin (ANCEF) IVPB 2g/100 mL premix     2 g 200 mL/hr over 30 Minutes Intravenous On call to O.R. 11/02/17 1313 11/02/17 1523   11/02/17 2300  ceFAZolin (ANCEF) IVPB 1 g/50 mL premix     1 g 100 mL/hr over 30 Minutes Intravenous Every 8 hours 11/02/17 2014 11/04/17 0600   11/02/17 1535  bacitracin 50,000 Units in sodium chloride 0.9 % 500 mL irrigation  Status:  Discontinued       As needed 11/02/17 1536 11/02/17 1648      Discharge Exam: Blood pressure 112/70, pulse 79, temperature 98.5 F (36.9 C), temperature source Oral, resp. rate 18, height 5\' 8"  (1.727 m), weight 72.6 kg (160 lb), last menstrual period 10/13/2017, SpO2 98 %. Neurologic: Grossly normal Ambulating and voiding well  Discharge Medications:   Allergies as of 11/04/2017   No Known Allergies     Medication List    TAKE these medications   HYDROcodone-acetaminophen 5-325 MG tablet Commonly known as:  NORCO/VICODIN Take 1 tablet by mouth every 4 (four) hours as needed for moderate pain.   levothyroxine 75 MCG tablet Commonly known as:  SYNTHROID, LEVOTHROID Take 75 mcg by mouth daily before breakfast.   methocarbamol 500 MG tablet Commonly known as:  ROBAXIN Take 1 tablet (500 mg total) by mouth 4 (four) times daily.       Disposition: home   Final Dx: repair pseudomeningocele  Discharge Instructions     Remove dressing in 72 hours   Complete by:  As directed    Call MD for:  difficulty breathing, headache or visual disturbances   Complete by:  As directed    Call MD for:  hives   Complete by:  As directed    Call MD for:  persistant dizziness or light-headedness   Complete by:  As directed    Call MD for:  persistant nausea and vomiting   Complete by:  As directed    Call MD for:  redness, tenderness, or signs of infection (pain, swelling, redness, odor or green/yellow discharge around incision site)   Complete by:   As directed    Call MD for:  severe uncontrolled pain   Complete by:  As directed    Call MD for:  temperature >100.4   Complete by:  As directed    Diet - low sodium heart healthy   Complete by:  As directed    Increase activity slowly   Complete by:  As directed       Follow-up Information    Tia Alert, MD. Schedule an appointment as soon as possible for a visit in 2 week(s).   Specialty:  Neurosurgery Contact information: [address] [place] [address] [phone]            Signed: Tiana Loft [facility] 11/04/2017, 12:58 PM

## 2017-11-04 NOTE — Care Management Note (Signed)
Case Management Note  Patient Details  Name: Alexandra Hamilton MRN: [id number] Date of Birth: 14-Apr-1990  Subjective/Objective:                    Action/Plan: Pt discharging home with self care. Pt has PCP, insurance and transportation home.   Expected Discharge Date:  11/04/17               Expected Discharge Plan:  Home/Self Care  In-House Referral:     Discharge planning Services     Post Acute Care Choice:    Choice offered to:     DME Arranged:    DME Agency:     HH Arranged:    HH Agency:     Status of Service:  Completed, signed off  If discussed at [employer] Length of Stay Meetings, dates discussed:    Additional Comments:  Alexandra BaloKelli F Keymiah Lyles, RN 11/04/2017, 1:37 PM

## 2017-11-04 NOTE — Progress Notes (Signed)
RN walked to pt's room and found HOB elevated and pt sitting up. When asked if MD came in and raised her up she replied "No I raised up to eat" and RN asked if was allowed to raise up to eat she stated "I thought my bed rest end today so I thought I could raise up to eat". Pt was informed there was no orders on when to start raising up and should wait for MD to come see her. Pt said she felt good with HOB raised, she didn't want to lay flat now and agreed to keep HOB at 20 degree angle. Pt denies any headache, nausea or discomfort. Back incision dsg remains flat, clean, dry and intact. Pt educated on s/s CSF leak and voices understanding. Call light within reach and will continue to closely monitor. Dionne BucyP. Amo Broderic Bara RN

## 2017-11-04 NOTE — Progress Notes (Signed)
Pt discharge instructions and education completed with pt and spouse at bedside; both voices understanding and denies any questions. Pt IV removed; back incision remains unremarkable and intact. Pt denies any s/s of CSF; pt pre-medicated prior to discharge upon request. Pt discharge home with spouse to transport her home. Pt transported off unit via wheelchair with belongings to the side. Dionne BucyP. Amo Ansley Mangiapane RN

## 2018-12-24 IMAGING — CT CT L SPINE W/ CM
1 of 7 series · 5 of 14 positions shown, 7 images · non-contrast
Comparison: Lumbar myelogram 05/16/2017

CLINICAL DATA: Postoperative CSF leak.  Pseudomeningocele.
TECHNIQUE: Contiguous axial images were obtained through the Lumbar spine after
the intrathecal infusion of infusion. Coronal and sagittal
reconstructions were obtained of the axial image sets.

[Series 3: l spine soft · axial · 0.41mm/px · z∈[-308,-152]mm · 5 of 79 slices shown, 7 images]
[im 14/79  soft-tissue]
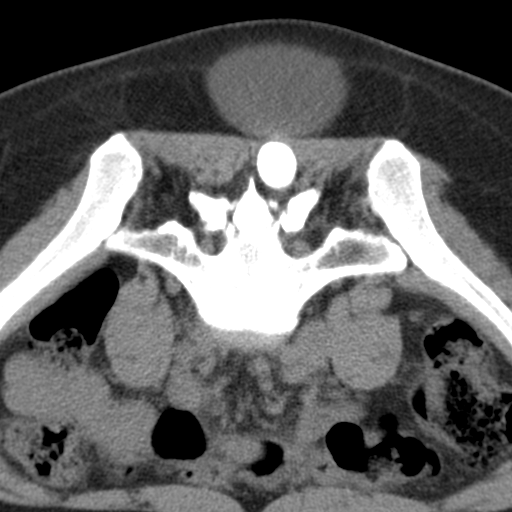
[im 14/79  bone]
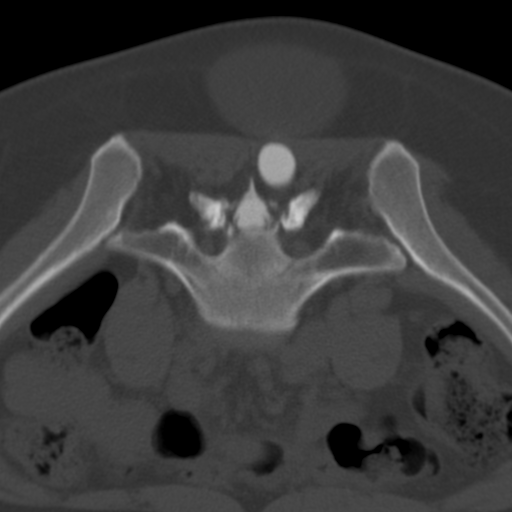
[im 27/79  bone]
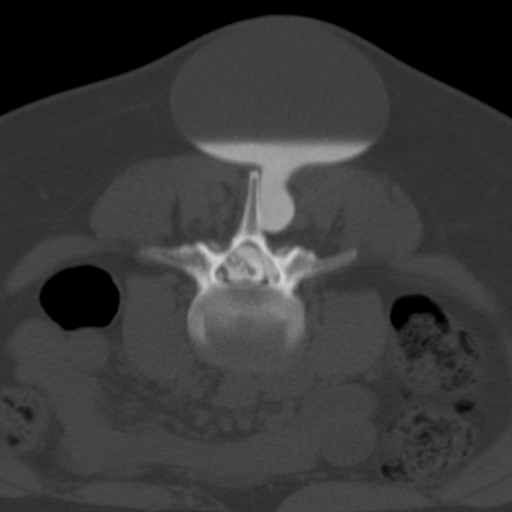
[im 40/79  bone]
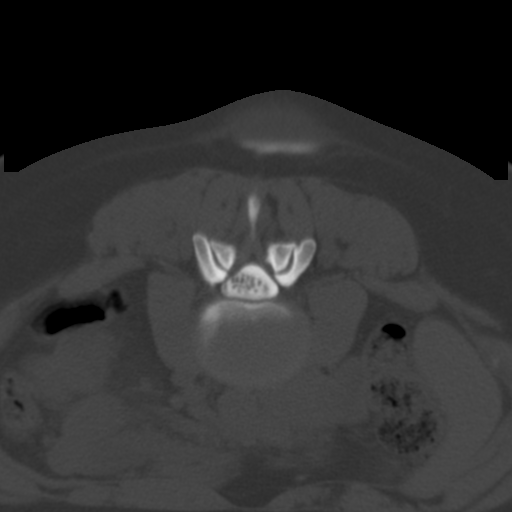
[im 53/79  bone]
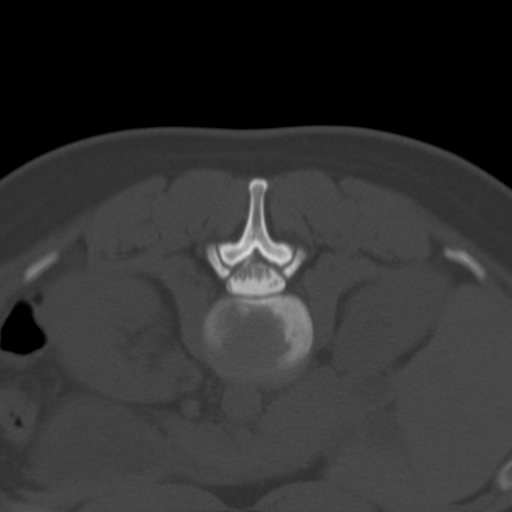
[im 66/79  soft-tissue]
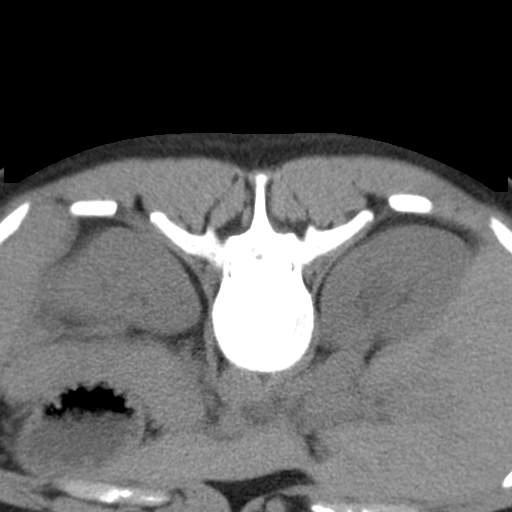
[im 66/79  bone]
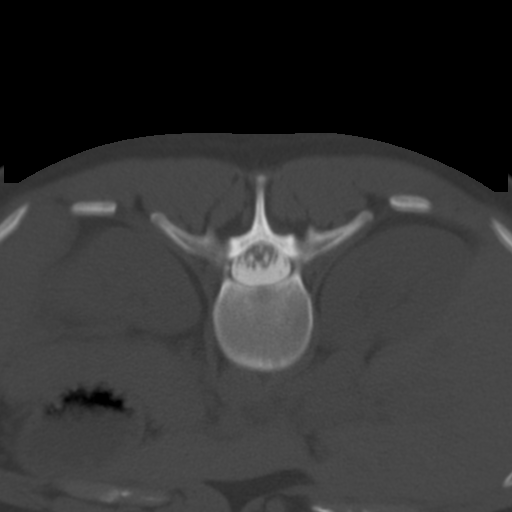

[5 of 14 positions shown; findings below may reference images not displayed]

EXAM:
LUMBAR MYELOGRAM

FLUOROSCOPY TIME:  Radiation Exposure Index (as provided by the
fluoroscopic device): 285.54 uGy*m2

If the device does not provide the exposure index:

Fluoroscopy Time:  45 seconds

Number of Acquired Images:  9

PROCEDURE:
After thorough discussion of risks and benefits of the procedure
including bleeding, infection, injury to nerves, blood vessels,
adjacent structures as well as headache and CSF leak, written and
oral informed consent was obtained. Consent was obtained by Dr.
Marcondes Callegari. Time out form was completed.

Patient was positioned prone on the fluoroscopy table. Local
anesthesia was provided with 1% lidocaine without epinephrine after
prepped and draped in the usual sterile fashion. Puncture was
performed at L2-3 using a 3 1/2 inch 22-gauge spinal needle via left
paramedian approach. Using a single pass through the dura, the
needle was placed within the thecal sac, with return of clear CSF.
15 mL of Isovue 3-UBB was injected into the thecal sac, with normal
opacification of the nerve roots and cauda equina consistent with
free flow within the subarachnoid space.

I personally performed the lumbar puncture and administered the
intrathecal contrast. I also personally supervised acquisition of
the myelogram images.
FINDINGS: LUMBAR MYELOGRAM FINDINGS:

Intrathecal injection was monitored in the lateral position. There
is early filling of the pseudomeningocele at the L5-S1 level.
Residual disc disease at L5-S1 is again noted. This results in mild
subarticular narrowing at the L5-S1 level. No other focal stenosis
is present. The superficial aspect of the CSF collection has
significantly increased in size since the prior exam.

CT LUMBAR MYELOGRAM FINDINGS:

Lumbar spine is imaged from the midbody of T12 through S2-3.
Vertebral body heights are normal. Alignment is anatomic.

The patient was scanned prone.

Disc levels at L4-5 and above are normal and stable.

L5-S1: A residual broad-based disc protrusion is again seen. There
is posterior displacement the S1 nerve roots, right greater than
left.

Right laminectomy is again seen. A large communicating
pseudomeningocele is present. The communication is identified
posteriorly and laterally. There is no ventral communication. The
residual disc is calcified.

The collection distends the skin surface. It measures 9.5 x 9.0 x
8.6 cm.
IMPRESSION: 1. Enlarging pseudomeningocele associated with CSF leak at the L5-S1
level.
2. The communication appears to be posterolateral at the laminectomy
site. There is no ventral leak identified.
3. Residual broad-based disc protrusion with displacement of the S1
nerve roots bilaterally by a residual calcified disc.
4. No significant disc disease at the L4-5 level or above.
